# Patient Record
Sex: Female | Born: 1995 | Race: Black or African American | Hispanic: No | Marital: Single | Smoking: Never smoker
Health system: Southern US, Community
[De-identification: ages and names within clinical notes are randomized; demographics above are authoritative.]

## PROBLEM LIST (undated history)

## (undated) DIAGNOSIS — D649 Anemia, unspecified: Secondary | ICD-10-CM

## (undated) DIAGNOSIS — N920 Excessive and frequent menstruation with regular cycle: Secondary | ICD-10-CM

## (undated) HISTORY — PX: TONSILLECTOMY: SUR1361

---

## 2017-03-25 ENCOUNTER — Encounter (HOSPITAL_COMMUNITY): Payer: Self-pay

## 2017-03-25 ENCOUNTER — Inpatient Hospital Stay (HOSPITAL_COMMUNITY)
Admission: AD | Admit: 2017-03-25 | Discharge: 2017-03-25 | Disposition: A | Discharge status: Home / Self Care | Payer: BLUE CROSS/BLUE SHIELD | Source: Ambulatory Visit | Attending: Obstetrics & Gynecology | Admitting: Obstetrics & Gynecology

## 2017-03-25 DIAGNOSIS — R109 Unspecified abdominal pain: Secondary | ICD-10-CM | POA: Diagnosis not present

## 2017-03-25 DIAGNOSIS — N920 Excessive and frequent menstruation with regular cycle: Secondary | ICD-10-CM | POA: Insufficient documentation

## 2017-03-25 DIAGNOSIS — Z9889 Other specified postprocedural states: Secondary | ICD-10-CM | POA: Insufficient documentation

## 2017-03-25 DIAGNOSIS — D500 Iron deficiency anemia secondary to blood loss (chronic): Secondary | ICD-10-CM | POA: Diagnosis not present

## 2017-03-25 DIAGNOSIS — Z888 Allergy status to other drugs, medicaments and biological substances status: Secondary | ICD-10-CM | POA: Insufficient documentation

## 2017-03-25 HISTORY — DX: Excessive and frequent menstruation with regular cycle: N92.0

## 2017-03-25 HISTORY — DX: Anemia, unspecified: D64.9

## 2017-03-25 LAB — CBC
HCT: 28.9 % — ABNORMAL LOW (ref 36.0–46.0)
Hemoglobin: 9 g/dL — ABNORMAL LOW (ref 12.0–15.0)
MCH: 25.6 pg — ABNORMAL LOW (ref 26.0–34.0)
MCHC: 31.1 g/dL (ref 30.0–36.0)
MCV: 82.1 fL (ref 78.0–100.0)
PLATELETS: 260 10*3/uL (ref 150–400)
RBC: 3.52 MIL/uL — ABNORMAL LOW (ref 3.87–5.11)
RDW: 14.9 % (ref 11.5–15.5)
WBC: 5.6 10*3/uL (ref 4.0–10.5)

## 2017-03-25 LAB — URINALYSIS, ROUTINE W REFLEX MICROSCOPIC

## 2017-03-25 LAB — URINALYSIS, MICROSCOPIC (REFLEX)

## 2017-03-25 LAB — POCT PREGNANCY, URINE: Preg Test, Ur: NEGATIVE

## 2017-03-25 MED ORDER — MEGESTROL ACETATE 40 MG PO TABS: 40.0000 mg | ORAL_TABLET | Freq: Two times a day (BID) | ORAL | 0 refills | Status: AC

## 2017-03-25 MED ORDER — FERROUS SULFATE 325 (65 FE) MG PO TABS: 325.0000 mg | ORAL_TABLET | Freq: Every day | ORAL | 2 refills | Status: DC

## 2017-03-25 MED ORDER — IBUPROFEN 800 MG PO TABS
800.0000 mg | ORAL_TABLET | Freq: Once | ORAL | Status: AC
  Administered 2017-03-25: 800 mg via ORAL
  Filled 2017-03-25: qty 1

## 2017-03-25 MED ORDER — DOCUSATE SODIUM 250 MG PO CAPS: 250.0000 mg | ORAL_CAPSULE | Freq: Every day | ORAL | 1 refills | Status: DC

## 2017-03-25 MED ORDER — IBUPROFEN 800 MG PO TABS: 800.0000 mg | ORAL_TABLET | Freq: Three times a day (TID) | ORAL | 0 refills | Status: DC

## 2017-03-25 NOTE — MAU Provider Note (Signed)
History     CSN: [id number]  Arrival date and time: 03/25/17 1719   First Provider Initiated Contact with Patient 03/25/17 1945     Chief Complaint  Patient presents with  . Abdominal Pain  . Vaginal Bleeding   HPI Terri Mack is a 22 y.o. non pregnant female who presents with vaginal bleeding and right sided abdominal pain. She states she started her period this morning and the bleeding became heavy around 4pm. She has cramping in the right lower side of her abdomen that she rates a 5/10. She states she took "something my friend gave me" for pain before she came in and it helped the pain. She reports a history of heavy periods and anemia because of them. She has been prescribed iron for the anemia but does not take it. She reports using birth control in the past that helped.   OB History    No data available      Past Medical History:  Diagnosis Date  . Anemia   . Heavy menstrual period     Past Surgical History:  Procedure Laterality Date  . TONSILLECTOMY      No family history on file.  Social History   Tobacco Use  . Smoking status: Never Smoker  . Smokeless tobacco: Never Used  Substance Use Topics  . Alcohol use: No    Frequency: Never  . Drug use: No    Allergies:  Allergies  Allergen Reactions  . Zyrtec [Cetirizine] Swelling    No medications prior to admission.    Review of Systems  Constitutional: Negative.  Negative for fatigue and fever.  HENT: Negative.   Respiratory: Negative.  Negative for shortness of breath.   Cardiovascular: Negative.  Negative for chest pain.  Gastrointestinal: Positive for abdominal pain. Negative for constipation, diarrhea, nausea and vomiting.  Genitourinary: Positive for vaginal bleeding. Negative for dysuria and vaginal discharge.  Neurological: Negative.  Negative for dizziness and headaches.   Physical Exam   Blood pressure 129/87, pulse 81, temperature 99.1 F (37.3 C), temperature source Oral, resp. rate  17, height 5\' 2"  (1.575 m), weight 137 lb (62.1 kg), last menstrual period 03/25/2017, SpO2 98 %.  Physical Exam  Nursing note and vitals reviewed. Constitutional: She is oriented to person, place, and time. She appears well-developed and well-nourished. No distress.  HENT:  Head: Normocephalic.  Eyes: Pupils are equal, round, and reactive to light.  Cardiovascular: Normal rate, regular rhythm and normal heart sounds.  Respiratory: Effort normal and breath sounds normal. No respiratory distress.  GI: Soft. Bowel sounds are normal. She exhibits no distension. There is no tenderness.  Genitourinary:  Genitourinary Comments: Small amount of dark red bleeding   Neurological: She is alert and oriented to person, place, and time.  Skin: Skin is warm and dry.  Psychiatric: She has a normal mood and affect. Her behavior is normal. Judgment and thought content normal.    MAU Course  Procedures Results for orders placed or performed during the hospital encounter of 03/25/17 (from the past 24 hour(s))  Urinalysis, Routine w reflex microscopic     Status: Abnormal   Collection Time: 03/25/17  3:21 PM  Result Value Ref Range   Color, Urine AMBER (A) YELLOW   APPearance CLOUDY (A) CLEAR   Specific Gravity, Urine  1.005 - 1.030    TEST NOT REPORTED DUE TO COLOR INTERFERENCE OF URINE PIGMENT   pH  5.0 - 8.0    TEST NOT REPORTED DUE TO COLOR  INTERFERENCE OF URINE PIGMENT   Glucose, UA (A) NEGATIVE mg/dL    TEST NOT REPORTED DUE TO COLOR INTERFERENCE OF URINE PIGMENT   Hgb urine dipstick (A) NEGATIVE    TEST NOT REPORTED DUE TO COLOR INTERFERENCE OF URINE PIGMENT   Bilirubin Urine (A) NEGATIVE    TEST NOT REPORTED DUE TO COLOR INTERFERENCE OF URINE PIGMENT   Ketones, ur (A) NEGATIVE mg/dL    TEST NOT REPORTED DUE TO COLOR INTERFERENCE OF URINE PIGMENT   Protein, ur (A) NEGATIVE mg/dL    TEST NOT REPORTED DUE TO COLOR INTERFERENCE OF URINE PIGMENT   Nitrite (A) NEGATIVE    TEST NOT REPORTED  DUE TO COLOR INTERFERENCE OF URINE PIGMENT   Leukocytes, UA (A) NEGATIVE    TEST NOT REPORTED DUE TO COLOR INTERFERENCE OF URINE PIGMENT  Urinalysis, Microscopic (reflex)     Status: Abnormal   Collection Time: 03/25/17  3:21 PM  Result Value Ref Range   RBC / HPF TOO NUMEROUS TO COUNT 0 - 5 RBC/hpf   WBC, UA 0-5 0 - 5 WBC/hpf   Bacteria, UA FEW (A) NONE SEEN   Squamous Epithelial / LPF 0-5 (A) NONE SEEN  Pregnancy, urine POC     Status: None   Collection Time: 03/25/17  5:32 PM  Result Value Ref Range   Preg Test, Ur NEGATIVE NEGATIVE  CBC     Status: Abnormal   Collection Time: 03/25/17  6:04 PM  Result Value Ref Range   WBC 5.6 4.0 - 10.5 K/uL   RBC 3.52 (L) 3.87 - 5.11 MIL/uL   Hemoglobin 9.0 (L) 12.0 - 15.0 g/dL   HCT 45.428.9 (L) 09.836.0 - 11.946.0 %   MCV 82.1 78.0 - 100.0 fL   MCH 25.6 (L) 26.0 - 34.0 pg   MCHC 31.1 30.0 - 36.0 g/dL   RDW 14.714.9 82.911.5 - 56.215.5 %   Platelets 260 150 - 400 K/uL   MDM UA, UPT CBC Ibuprofen 800mg  PO  Assessment and Plan   1. Menorrhagia with regular cycle   2. Iron deficiency anemia due to chronic blood loss    -Discharge home in stable condition -Rx for megace, ibuprofen, ferrous sulfate and colace given to patient -Bleeding precautions discussed -Patient [facility] -Patient may return to MAU as needed or if her condition were to change or worsen  Rolm BookbinderCaroline M Ashly Yepez CNM 03/25/2017, 8:04 PM

## 2017-03-25 NOTE — Discharge Instructions (Signed)
Abnormal Uterine Bleeding Abnormal uterine bleeding means bleeding more than usual from your uterus. It can include:  Bleeding between periods.  Bleeding after sex.  Bleeding that is heavier than normal.  Periods that last longer than usual.  Bleeding after you have stopped having your period (menopause).  There are many problems that may cause this. You should see a doctor for any kind of bleeding that is not normal. Treatment depends on the cause of the bleeding. Follow these instructions at home:  Watch your condition for any changes.  Do not use tampons, douche, or have sex, if your doctor tells you not to.  Change your pads often.  Get regular well-woman exams. Make sure they include a pelvic exam and cervical cancer screening.  Keep all follow-up visits as told by your doctor. This is important. Contact a doctor if:  The bleeding lasts more than one week.  You feel dizzy at times.  You feel like you are going to throw up (nauseous).  You throw up. Get help right away if:  You pass out.  You have to change pads every hour.  You have belly (abdominal) pain.  You have a fever.  You get sweaty.  You get weak.  You passing large blood clots from your vagina. Summary  Abnormal uterine bleeding means bleeding more than usual from your uterus.  There are many problems that may cause this. You should see a doctor for any kind of bleeding that is not normal.  Treatment depends on the cause of the bleeding. This information is not intended to replace advice given to you by your health care provider. Make sure you discuss any questions you have with your health care provider. Document Released: 10/25/2008 Document Revised: 12/23/2015 Document Reviewed: 12/23/2015 Elsevier Interactive Patient Education  2017 Elsevier Inc.   Anemia Anemia is a condition in which you do not have enough red blood cells or hemoglobin. Hemoglobin is a substance in red blood cells  that carries oxygen. When you do not have enough red blood cells or hemoglobin (are anemic), your body cannot get enough oxygen and your organs may not work properly. As a result, you may feel very tired or have other problems. What are the causes? Common causes of anemia include:  Excessive bleeding. Anemia can be caused by excessive bleeding inside or outside the body, including bleeding from the intestine or from periods in women.  Poor nutrition.  Long-lasting (chronic) kidney, thyroid, and liver disease.  Bone marrow disorders.  Cancer and treatments for cancer.  HIV (human immunodeficiency virus) and AIDS (acquired immunodeficiency syndrome).  Treatments for HIV and AIDS.  Spleen problems.  Blood disorders.  Infections, medicines, and autoimmune disorders that destroy red blood cells.  What are the signs or symptoms? Symptoms of this condition include:  Minor weakness.  Dizziness.  Headache.  Feeling heartbeats that are irregular or faster than normal (palpitations).  Shortness of breath, especially with exercise.  Paleness.  Cold sensitivity.  Indigestion.  Nausea.  Difficulty sleeping.  Difficulty concentrating.  Symptoms may occur suddenly or develop slowly. If your anemia is mild, you may not have symptoms. How is this diagnosed? This condition is diagnosed based on:  Blood tests.  Your medical history.  A physical exam.  Bone marrow biopsy.  Your health care provider may also check your stool (feces) for blood and may do additional testing to look for the cause of your bleeding. You may also have other tests, including:  Imaging tests, such  as a CT scan or MRI.  Endoscopy.  Colonoscopy.  How is this treated? Treatment for this condition depends on the cause. If you continue to lose a lot of blood, you may need to be treated at a hospital. Treatment may include:  Taking supplements of iron, vitamin L45, or folic acid.  Taking a  hormone medicine (erythropoietin) that can help to stimulate red blood cell growth.  Having a blood transfusion. This may be needed if you lose a lot of blood.  Making changes to your diet.  Having surgery to remove your spleen.  Follow these instructions at home:  Take over-the-counter and prescription medicines only as told by your health care provider.  Take supplements only as told by your health care provider.  Follow any diet instructions that you were given.  Keep all follow-up visits as told by your health care provider. This is important. Contact a health care provider if:  You develop new bleeding anywhere in the body. Get help right away if:  You are very weak.  You are short of breath.  You have pain in your abdomen or chest.  You are dizzy or feel faint.  You have trouble concentrating.  You have bloody or black, tarry stools.  You vomit repeatedly or you vomit up blood. Summary  Anemia is a condition in which you do not have enough red blood cells or enough of a substance in your red blood cells that carries oxygen (hemoglobin).  Symptoms may occur suddenly or develop slowly.  If your anemia is mild, you may not have symptoms.  This condition is diagnosed with blood tests as well as a medical history and physical exam. Other tests may be needed.  Treatment for this condition depends on the cause of the anemia. This information is not intended to replace advice given to you by your health care provider. Make sure you discuss any questions you have with your health care provider. Document Released: 02/05/2004 Document Revised: 01/30/2016 Document Reviewed: 01/30/2016 Elsevier Interactive Patient Education  2018 [place] implant What is this medicine? ETONOGESTREL (et oh noe JES trel) is a contraceptive (birth control) device. It is used to prevent pregnancy. It can be used for up to 3 years. This medicine may be used for other  purposes; ask your health care provider or pharmacist if you have questions. COMMON BRAND NAME(S): Implanon, Nexplanon What should I tell my health care provider before I take this medicine? They need to know if you have any of these conditions: -abnormal vaginal bleeding -blood vessel disease or blood clots -cancer of the breast, cervix, or liver -depression -diabetes -gallbladder disease -headaches -heart disease or recent heart attack -high blood pressure -high cholesterol -kidney disease -liver disease -renal disease -seizures -tobacco smoker -an unusual or allergic reaction to etonogestrel, other hormones, anesthetics or antiseptics, medicines, foods, dyes, or preservatives -pregnant or trying to get pregnant -breast-feeding How should I use this medicine? This device is inserted just under the skin on the inner side of your upper arm by a health care professional. Talk to your pediatrician regarding the use of this medicine in children. Special care may be needed. Overdosage: If you think you have taken too much of this medicine contact a poison control center or emergency room at once. NOTE: This medicine is only for you. Do not share this medicine with others. What if I miss a dose? This does not apply. What may interact with this medicine? Do not take this medicine  with any of the following medications: -amprenavir -bosentan -fosamprenavir This medicine may also interact with the following medications: -barbiturate medicines for inducing sleep or treating seizures -certain medicines for fungal infections like ketoconazole and itraconazole -grapefruit juice -griseofulvin -medicines to treat seizures like carbamazepine, felbamate, oxcarbazepine, phenytoin, topiramate -modafinil -phenylbutazone -rifampin -rufinamide -some medicines to treat HIV infection like atazanavir, indinavir, lopinavir, nelfinavir, tipranavir, ritonavir -St. John's wort This list may not  describe all possible interactions. Give your health care provider a list of all the medicines, herbs, non-prescription drugs, or dietary supplements you use. Also tell them if you smoke, drink alcohol, or use illegal drugs. Some items may interact with your medicine. What should I watch for while using this medicine? This product does not protect you against HIV infection (AIDS) or other sexually transmitted diseases. You should be able to feel the implant by pressing your fingertips over the skin where it was inserted. Contact your doctor if you cannot feel the implant, and use a non-hormonal birth control method (such as condoms) until your doctor confirms that the implant is in place. If you feel that the implant may have broken or become bent while in your arm, contact your healthcare provider. What side effects may I notice from receiving this medicine? Side effects that you should report to your doctor or health care professional as soon as possible: -allergic reactions like skin rash, itching or hives, swelling of the face, lips, or tongue -breast lumps -changes in emotions or moods -depressed mood -heavy or prolonged menstrual bleeding -pain, irritation, swelling, or bruising at the insertion site -scar at site of insertion -signs of infection at the insertion site such as fever, and skin redness, pain or discharge -signs of pregnancy -signs and symptoms of a blood clot such as breathing problems; changes in vision; chest pain; severe, sudden headache; pain, swelling, warmth in the leg; trouble speaking; sudden numbness or weakness of the face, arm or leg -signs and symptoms of liver injury like dark yellow or brown urine; general ill feeling or flu-like symptoms; light-colored stools; loss of appetite; nausea; right upper belly pain; unusually weak or tired; yellowing of the eyes or skin -unusual vaginal bleeding, discharge -signs and symptoms of a stroke like changes in vision;  confusion; trouble speaking or understanding; severe headaches; sudden numbness or weakness of the face, arm or leg; trouble walking; dizziness; loss of balance or coordination Side effects that usually do not require medical attention (report to your doctor or health care professional if they continue or are bothersome): -acne -back pain -breast pain -changes in weight -dizziness -general ill feeling or flu-like symptoms -headache -irregular menstrual bleeding -nausea -sore throat -vaginal irritation or inflammation This list may not describe all possible side effects. Call your doctor for medical advice about side effects. You may report side effects to FDA at 1-800-FDA-1088. Where should I keep my medicine? This drug is given in a hospital or clinic and will not be stored at home. NOTE: This sheet is a summary. It may not cover all possible information. If you have questions about this medicine, talk to your doctor, pharmacist, or health care provider.  2018 Elsevier/Gold Standard (2015-07-17 11:19:22)

## 2017-03-25 NOTE — MAU Note (Signed)
Pt reports she started her period this am and around 3 pm, she began having lower abd cramping and heavy bleeding. Reports this happened one time before. Unsure of dx but was put on birth control and that took care of it

## 2018-06-11 ENCOUNTER — Encounter (HOSPITAL_COMMUNITY): Payer: Self-pay

## 2018-06-11 ENCOUNTER — Other Ambulatory Visit: Payer: Self-pay

## 2018-06-11 ENCOUNTER — Emergency Department (HOSPITAL_COMMUNITY): Payer: Commercial Managed Care - PPO

## 2018-06-11 ENCOUNTER — Emergency Department (HOSPITAL_COMMUNITY)
Admission: EM | Admit: 2018-06-11 | Discharge: 2018-06-11 | Disposition: A | Discharge status: Home / Self Care | Payer: Commercial Managed Care - PPO | Attending: Emergency Medicine | Admitting: Emergency Medicine

## 2018-06-11 DIAGNOSIS — R079 Chest pain, unspecified: Secondary | ICD-10-CM | POA: Diagnosis present

## 2018-06-11 DIAGNOSIS — R51 Headache: Secondary | ICD-10-CM | POA: Diagnosis not present

## 2018-06-11 DIAGNOSIS — R0602 Shortness of breath: Secondary | ICD-10-CM | POA: Diagnosis not present

## 2018-06-11 DIAGNOSIS — R05 Cough: Secondary | ICD-10-CM | POA: Diagnosis not present

## 2018-06-11 DIAGNOSIS — Z20828 Contact with and (suspected) exposure to other viral communicable diseases: Secondary | ICD-10-CM | POA: Insufficient documentation

## 2018-06-11 DIAGNOSIS — R0789 Other chest pain: Secondary | ICD-10-CM | POA: Diagnosis not present

## 2018-06-11 LAB — CBC
HCT: 30.5 % — ABNORMAL LOW (ref 36.0–46.0)
Hemoglobin: 9.2 g/dL — ABNORMAL LOW (ref 12.0–15.0)
MCH: 25.8 pg — ABNORMAL LOW (ref 26.0–34.0)
MCHC: 30.2 g/dL (ref 30.0–36.0)
MCV: 85.7 fL (ref 80.0–100.0)
Platelets: 326 10*3/uL (ref 150–400)
RBC: 3.56 MIL/uL — ABNORMAL LOW (ref 3.87–5.11)
RDW: 15.5 % (ref 11.5–15.5)
WBC: 3.5 10*3/uL — ABNORMAL LOW (ref 4.0–10.5)
nRBC: 0 % (ref 0.0–0.2)

## 2018-06-11 LAB — BASIC METABOLIC PANEL
Anion gap: 8 (ref 5–15)
BUN: 10 mg/dL (ref 6–20)
CO2: 23 mmol/L (ref 22–32)
Calcium: 8.7 mg/dL — ABNORMAL LOW (ref 8.9–10.3)
Chloride: 109 mmol/L (ref 98–111)
Creatinine, Ser: 0.72 mg/dL (ref 0.44–1.00)
GFR calc Af Amer: >60 mL/min (ref >60)
GFR calc non Af Amer: >60 mL/min (ref >60)
Glucose, Bld: 95 mg/dL (ref 70–99)
Potassium: 3.9 mmol/L (ref 3.5–5.1)
Sodium: 140 mmol/L (ref 135–145)

## 2018-06-11 LAB — TROPONIN I: Troponin I: <0.03 ng/mL (ref <0.03)

## 2018-06-11 LAB — D-DIMER, QUANTITATIVE: D-Dimer, Quant: 0.41 ug/mL-FEU (ref 0.00–0.50)

## 2018-06-11 LAB — HCG, QUANTITATIVE, PREGNANCY: hCG, Beta Chain, Quant, S: <1 m[IU]/mL

## 2018-06-11 MED ORDER — SODIUM CHLORIDE 0.9% FLUSH: 3.0000 mL | Freq: Once | INTRAVENOUS | Status: DC

## 2018-06-11 MED ORDER — ALBUTEROL SULFATE HFA 108 (90 BASE) MCG/ACT IN AERS
1.0000 | INHALATION_SPRAY | Freq: Once | RESPIRATORY_TRACT | Status: AC
  Administered 2018-06-11: 2 via RESPIRATORY_TRACT
  Filled 2018-06-11: qty 6.7

## 2018-06-11 MED ORDER — KETOROLAC TROMETHAMINE 15 MG/ML IJ SOLN
15.0000 mg | Freq: Once | INTRAMUSCULAR | Status: AC
  Administered 2018-06-11: 15 mg via INTRAVENOUS
  Filled 2018-06-11: qty 1

## 2018-06-11 MED ORDER — IBUPROFEN 600 MG PO TABS: 600.0000 mg | ORAL_TABLET | Freq: Four times a day (QID) | ORAL | 0 refills | Status: AC | PRN

## 2018-06-11 NOTE — ED Triage Notes (Signed)
Patient c/o mid chest pain, SOB,and headache at 0900 today. Patieant states she feels like she "has to take short, choppy breaths." Patient denies any fever, nausea/vomiting.

## 2018-06-11 NOTE — ED Provider Notes (Signed)
[facility]-EMERGENCY DEPT Provider Note   CSN: [id number] Arrival date & time: 06/11/18  1322    History   Chief [id number] Chief Complaint  Patient presents with  . Chest Pain  . Headache  . Cough    HPI Terri Mack is a 23 y.o. female who presents with chest pain and SOB. PMH significant for exercise induced asthma, iron def anemia. She states that she woke up this morning with feeling like she couldn't breathe. She states she can't take a deep breath and is taking "choppy" breaths. She reports associated chest pressure over the sternum. She also reports a lot of fatigue. She denies fever, chills, cough, wheezing, abdominal pain, N/V, leg swelling. No recent surgery/travel/immobilization, hx of cancer, hemoptysis, prior DVT/PE, or hormone use. She denies any stress or anxiety. She has not tried anything for her symptoms. She is on her period currently and has heavy periods.   HPI  Past Medical History:  Diagnosis Date  . Anemia   . Heavy menstrual period     There are no active problems to display for this patient.   Past Surgical History:  Procedure Laterality Date  . TONSILLECTOMY       OB History   No obstetric history on file.      Home Medications    Prior to Admission medications   Medication Sig Start Date End Date Taking? Authorizing Provider  docusate sodium (COLACE) 250 MG capsule Take 1 capsule (250 mg total) by mouth daily. Patient not taking: Reported on 06/11/2018 03/25/17   Rolm BookbinderNeill, Caroline M, CNM  ferrous sulfate 325 (65 FE) MG tablet Take 1 tablet (325 mg total) by mouth daily. Patient not taking: Reported on 06/11/2018 03/25/17   Rolm BookbinderNeill, Caroline M, CNM  ibuprofen (ADVIL,MOTRIN) 800 MG tablet Take 1 tablet (800 mg total) by mouth 3 (three) times daily. Patient not taking: Reported on 06/11/2018 03/25/17   Rolm BookbinderNeill, Caroline M, CNM    Family History No family history on file.  Social History Social History   Tobacco Use  .  Smoking status: Never Smoker  . Smokeless tobacco: Never Used  Substance Use Topics  . Alcohol use: No    Frequency: Never  . Drug use: No     Allergies   Zyrtec [cetirizine]   Review of Systems Review of Systems  Constitutional: Negative for chills and fever.  Respiratory: Positive for shortness of breath. Negative for cough and wheezing.   Cardiovascular: Positive for chest pain. Negative for palpitations and leg swelling.  Gastrointestinal: Negative for abdominal pain, nausea and vomiting.  Neurological: Negative for dizziness and syncope.  All other systems reviewed and are negative.    Physical Exam Updated Vital Signs BP 114/73   Pulse 68   Temp 98.3 F (36.8 C) (Oral)   Resp 18   Ht 5\' 2"  (1.575 m)   Wt 70.3 kg   LMP 06/11/2018   SpO2 100%   BMI 28.35 kg/m   Physical Exam Vitals signs and nursing note reviewed.  Constitutional:      General: She is not in acute distress.    Appearance: Normal appearance. She is well-developed.     Comments: Calm. Texting on phone  HENT:     Head: Normocephalic and atraumatic.  Eyes:     General: No scleral icterus.       Right eye: No discharge.        Left eye: No discharge.     Conjunctiva/sclera: Conjunctivae normal.  Pupils: Pupils are equal, round, and reactive to light.  Neck:     Musculoskeletal: Normal range of motion.  Cardiovascular:     Rate and Rhythm: Normal rate and regular rhythm.     Comments: Takes shallow breaths Pulmonary:     Effort: Pulmonary effort is normal. No respiratory distress.     Breath sounds: Normal breath sounds.  Chest:     Chest wall: Tenderness (over manubrium) present.  Abdominal:     General: There is no distension.     Palpations: Abdomen is soft.     Tenderness: There is no abdominal tenderness.  Skin:    General: Skin is warm and dry.  Neurological:     Mental Status: She is alert and oriented to person, place, and time.  Psychiatric:        Behavior: Behavior  normal.      ED Treatments / Results  Labs (all labs ordered are listed, but only abnormal results are displayed) Labs Reviewed  BASIC METABOLIC PANEL - Abnormal; Notable for the following components:      Result Value   Calcium 8.7 (*)    All other components within normal limits  CBC - Abnormal; Notable for the following components:   WBC 3.5 (*)    RBC 3.56 (*)    Hemoglobin 9.2 (*)    HCT 30.5 (*)    MCH 25.8 (*)    All other components within normal limits  NOVEL CORONAVIRUS, NAA (HOSPITAL ORDER, SEND-OUT TO REF LAB)  TROPONIN I  D-DIMER, QUANTITATIVE (NOT AT [facility])  HCG, QUANTITATIVE, PREGNANCY    EKG EKG Interpretation  Date/Time:  Sunday Jun 11 2018 13:30:56 EDT Ventricular Rate:  82 PR Interval:    QRS Duration: 78 QT Interval:  373 QTC Calculation: 436 R Axis:   79 Text Interpretation:  Sinus rhythm Normal ECG Confirmed by Geoffery Lyons ([id number]) on 06/11/2018 1:34:46 PM   Radiology Dg Chest 2 View  Result Date: 06/11/2018 CLINICAL DATA:  Mid chest pain.  Shortness of breath. EXAM: CHEST - 2 VIEW COMPARISON:  None. FINDINGS: The heart size and mediastinal contours are within normal limits. Both lungs are clear. The visualized skeletal structures are unremarkable. IMPRESSION: No active cardiopulmonary disease. Electronically Signed   By: Gerome Sam III M.D   On: 06/11/2018 14:13    Procedures Procedures (including critical care time)  Medications Ordered in ED Medications  sodium chloride flush (NS) 0.9 % injection 3 mL (has no administration in time range)  albuterol (VENTOLIN HFA) 108 (90 Base) MCG/ACT inhaler 1-2 puff (2 puffs Inhalation Given 06/11/18 1510)  ketorolac (TORADOL) 15 MG/ML injection 15 mg (15 mg Intravenous Given 06/11/18 1509)     Initial Impression / Assessment and Plan / ED Course  I have reviewed the triage vital signs and the nursing notes.  Pertinent labs & imaging results that were available during my care of the patient were  reviewed by me and considered in my medical decision making (see chart for details).  23 year old female presents with chest wall pain and SOB since this morning. Chest pain work up is reassuring. Doubt ACS, PE, pericarditis, esophageal rupture, tension pneumothorax, aortic dissection, cardiac tamponade. EKG is NSR. CXR is negative. Trop is 0. Do not think she needs a repeat as symptoms are very atypical. D-dimer is negative. Unclear etiology but suspect costochondritis vs MSK pain. She was given an inhaler and Toradol and feels better but symptoms haven't resolved. She is asking about a  COVID test. Her mom is working at a nursing home and her boyfriend tested positive for COVID. Will send this off but have a lower suspicion for this without fever or cough. She was advised to self-isolate at home.  Final Clinical Impressions(s) / ED Diagnoses   Final diagnoses:  Chest wall pain  SOB (shortness of breath)    ED Discharge Orders    None       Bethel Born, PA-C 06/11/18 1642    Geoffery Lyons, MD 06/13/18 [phone]

## 2018-06-11 NOTE — ED Notes (Signed)
ED Provider at bedside. 

## 2018-06-11 NOTE — Discharge Instructions (Signed)
Please use inhaler for shortness of breath Take Ibuprofen for pain every 6-8 hours for the next 5 days. Take with food. A COVID-19 test has been sent today. Please self-isolate at home. Do not go to work and do not interact with family members until you get the test results back. You can review your test results on MyChart but you will be notified in the next 1-2 days if the test come back positive. Return if worsening

## 2018-06-13 LAB — NOVEL CORONAVIRUS, NAA (HOSP ORDER, SEND-OUT TO REF LAB; TAT 18-24 HRS): SARS-CoV-2, NAA: NOT DETECTED

## 2019-10-24 IMAGING — CR CHEST - 2 VIEW
2 series · 2 of 2 positions shown · non-contrast
Comparison: None.

CLINICAL DATA: Mid chest pain.  Shortness of breath.

EXAM:
CHEST - 2 VIEW

[w chest lat]
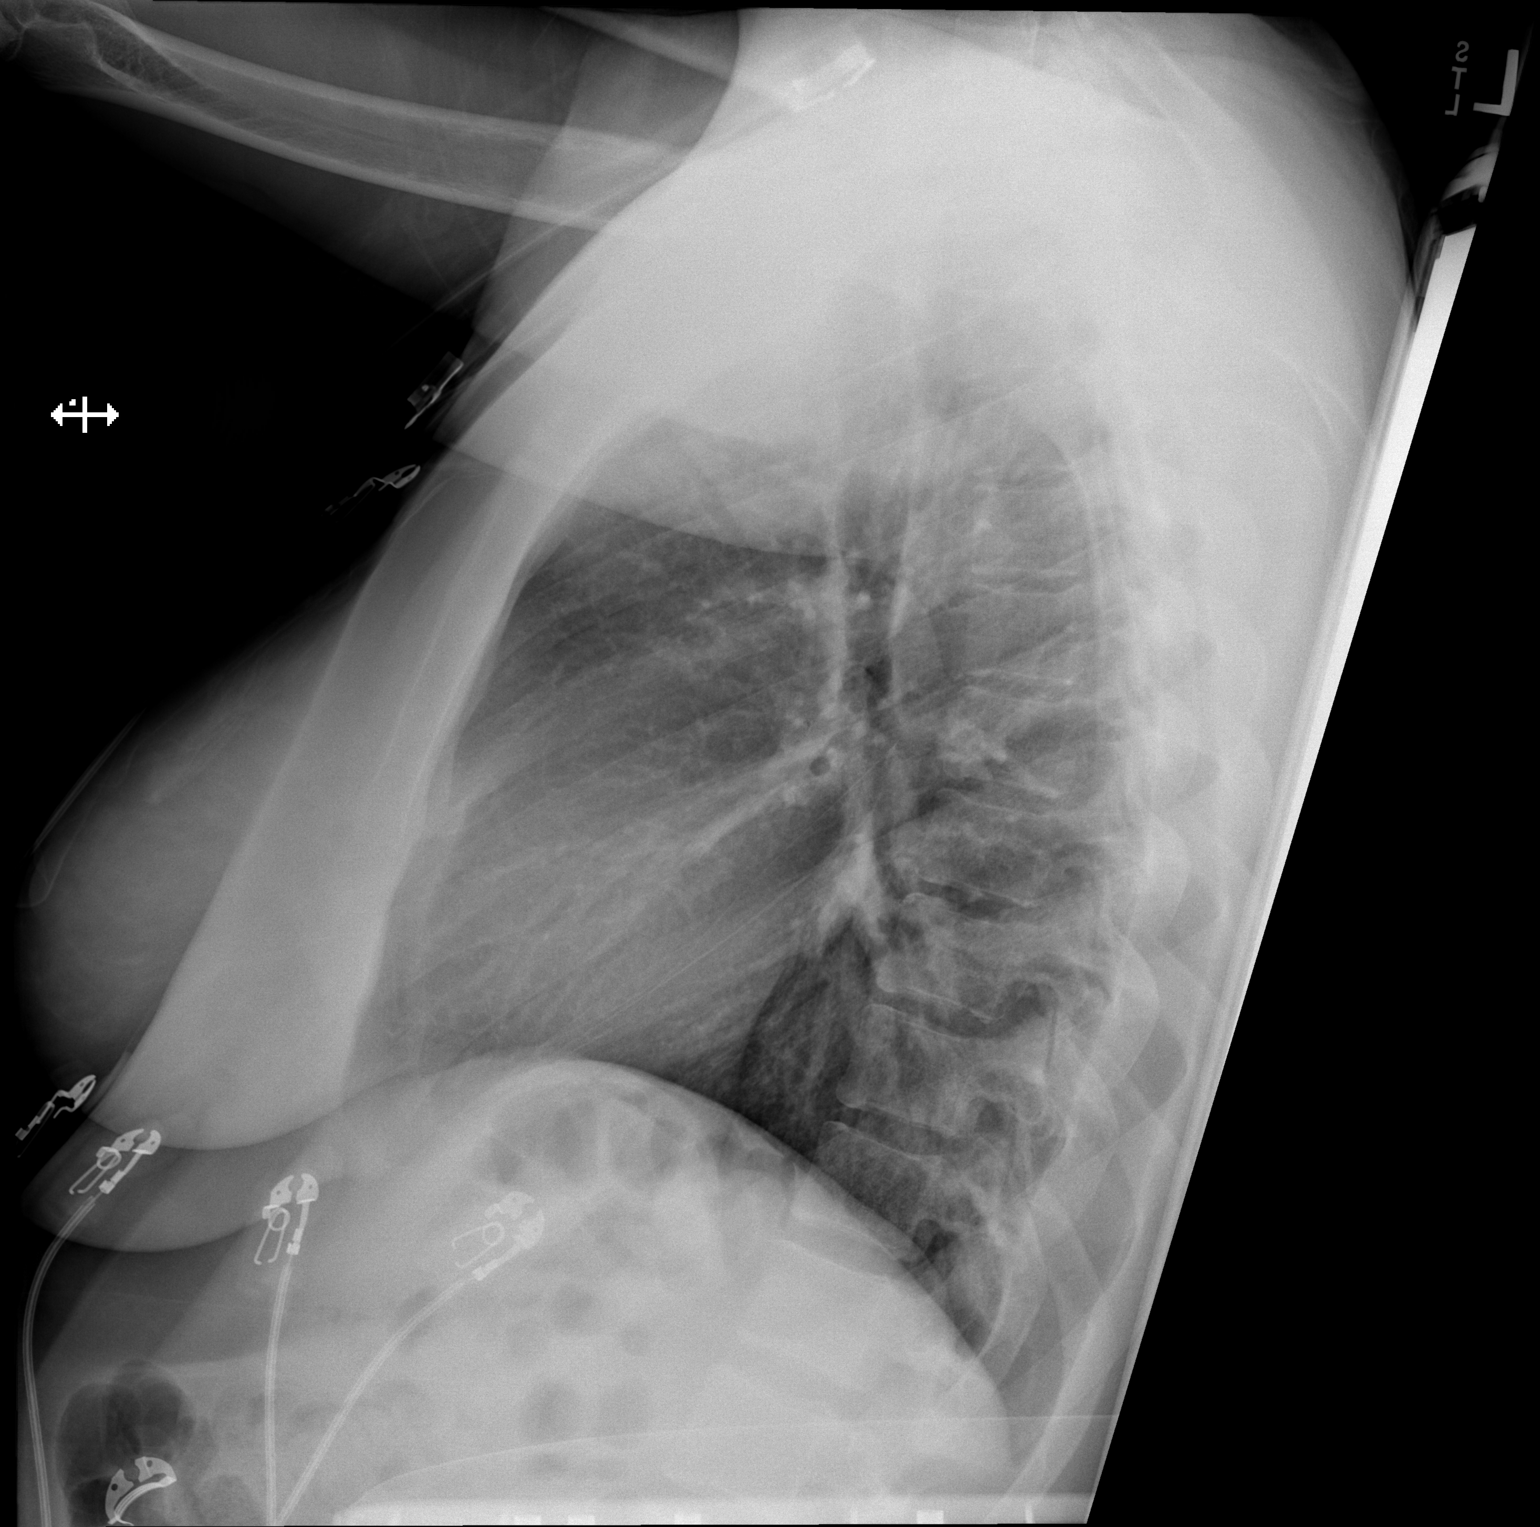

[x chest ap]
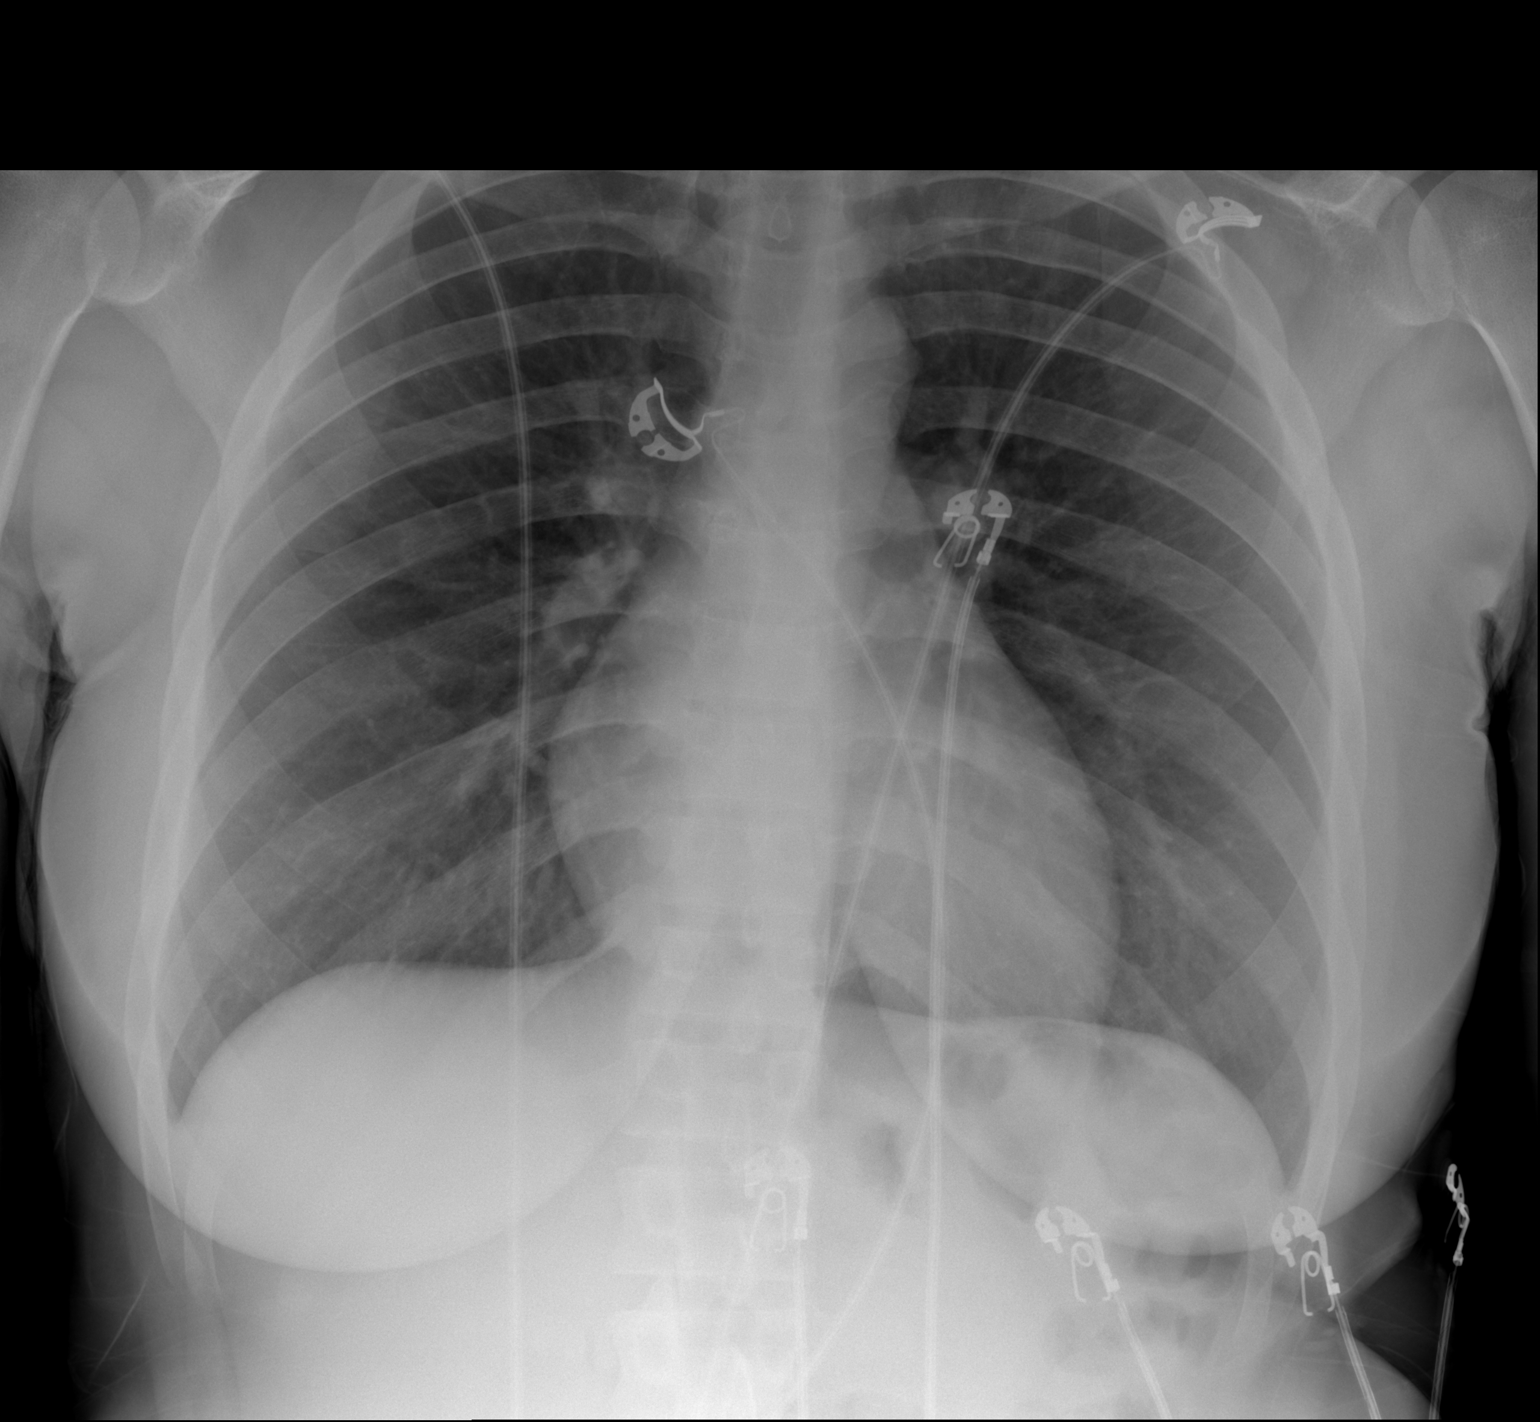

[2 of 2 positions shown; findings below may reference images not displayed]

FINDINGS: The heart size and mediastinal contours are within normal limits.
Both lungs are clear. The visualized skeletal structures are
unremarkable.
IMPRESSION: No active cardiopulmonary disease.
